# Patient Record
Sex: Female | Born: 1970 | Race: White | Hispanic: No | Marital: Married | Smoking: Current every day smoker
Health system: Southern US, Community
[De-identification: ages and names within clinical notes are randomized; demographics above are authoritative.]

## PROBLEM LIST (undated history)

## (undated) DIAGNOSIS — K5792 Diverticulitis of intestine, part unspecified, without perforation or abscess without bleeding: Secondary | ICD-10-CM

## (undated) HISTORY — PX: KNEE ARTHROSCOPY W/ ACL RECONSTRUCTION: SHX1858

---

## 2009-06-16 ENCOUNTER — Ambulatory Visit: Payer: Self-pay | Admitting: Internal Medicine

## 2010-08-21 ENCOUNTER — Ambulatory Visit: Payer: Self-pay | Admitting: Internal Medicine

## 2011-10-22 ENCOUNTER — Ambulatory Visit: Payer: Self-pay

## 2011-10-22 LAB — URINALYSIS, COMPLETE
Glucose,UR: NEGATIVE mg/dL (ref 0–75)
Leukocyte Esterase: NEGATIVE
Nitrite: NEGATIVE
Ph: 6 (ref 4.5–8.0)
Protein: NEGATIVE
Specific Gravity: 1.015 (ref 1.003–1.030)

## 2011-10-22 LAB — BASIC METABOLIC PANEL
Anion Gap: 6 — ABNORMAL LOW (ref 7–16)
Calcium, Total: 8.8 mg/dL (ref 8.5–10.1)
Co2: 30 mmol/L (ref 21–32)
EGFR (African American): >60
EGFR (Non-African Amer.): >60
Osmolality: 277 (ref 275–301)

## 2011-10-22 LAB — CBC WITH DIFFERENTIAL/PLATELET
Basophil #: 0 10*3/uL (ref 0.0–0.1)
Eosinophil %: 0.7 %
HCT: 42.5 % (ref 35.0–47.0)
HGB: 14.5 g/dL (ref 12.0–16.0)
Lymphocyte #: 1 10*3/uL (ref 1.0–3.6)
MCV: 95 fL (ref 80–100)
Monocyte %: 7.8 %
Neutrophil #: 8.7 10*3/uL — ABNORMAL HIGH (ref 1.4–6.5)
RBC: 4.48 10*6/uL (ref 3.80–5.20)
RDW: 12.5 % (ref 11.5–14.5)
WBC: 10.6 10*3/uL (ref 3.6–11.0)

## 2011-10-22 LAB — PREGNANCY, URINE: Pregnancy Test, Urine: NEGATIVE m[IU]/mL

## 2012-11-29 IMAGING — CT CT ABD-PELV W/ CM
1 of 3 series · 14 of 32 positions shown, 19 images · IV contrast (isovue)
Comparison: None

REASON FOR EXAM: lt lower abd pain  CR  [phone]
COMMENTS:

PROCEDURE:     JACKI - JACKI ABDOMEN / PELVIS W  - October 22, 2011  [DATE]
RESULT:     History: Lower bowel pain
TECHNIQUE: Multiple axial images of the abdomen and pelvis were performed
from the lung bases to the pubic symphysis, with p.o. contrast and with 100
ml of Isovue 300 intravenous contrast.

[Series 3: soft tissue · axial · 0.67mm/px · z∈[-464,-62]mm · 14 of 152 slices shown, 19 images]
[im 9/152  soft-tissue]
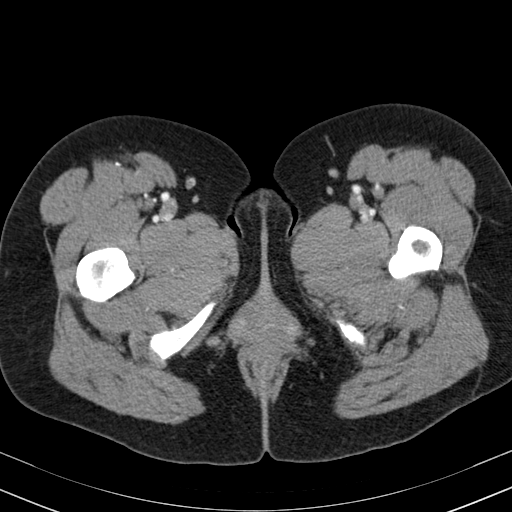
[im 9/152  bone]
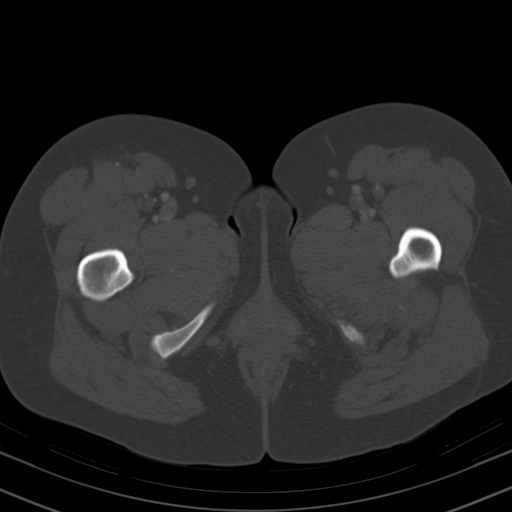
[im 17/152  soft-tissue]
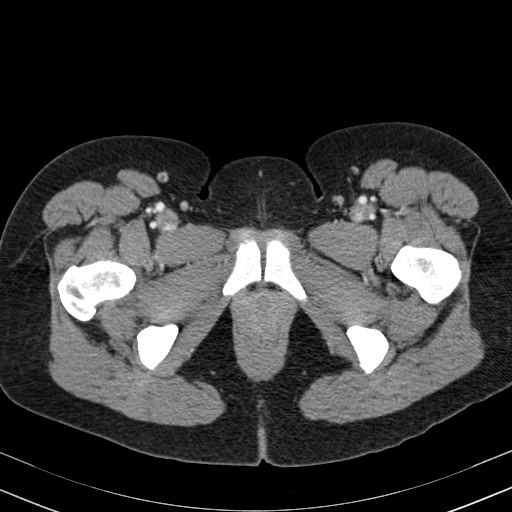
[im 34/152  soft-tissue]
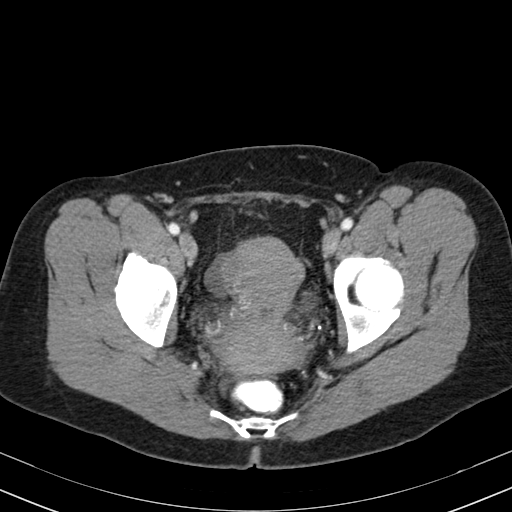
[im 42/152  soft-tissue]
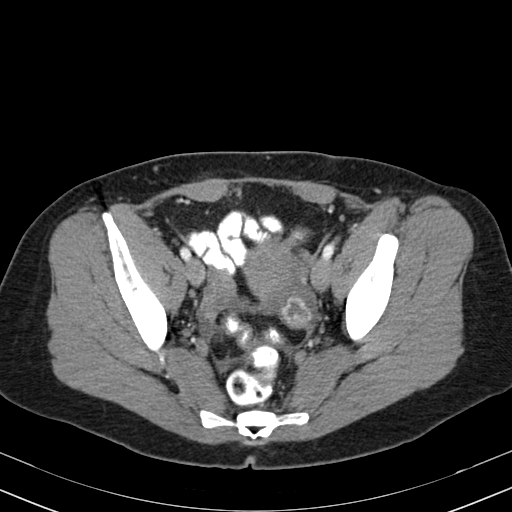
[im 51/152  soft-tissue]
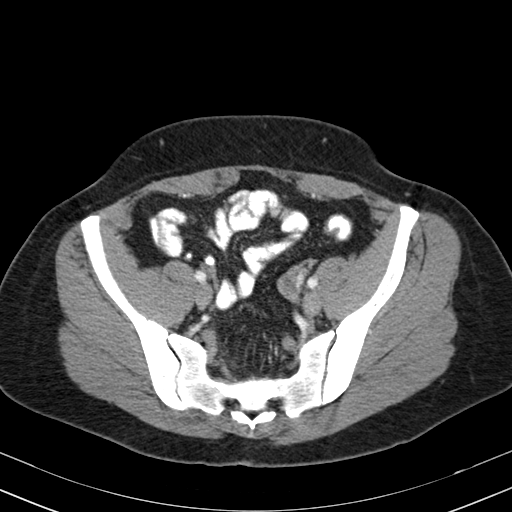
[im 68/152  soft-tissue]
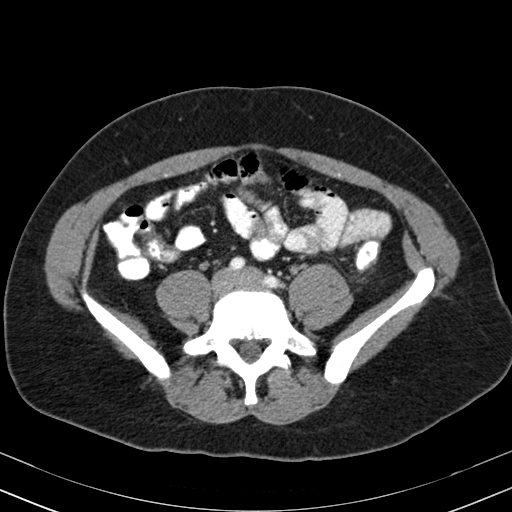
[im 76/152  soft-tissue]
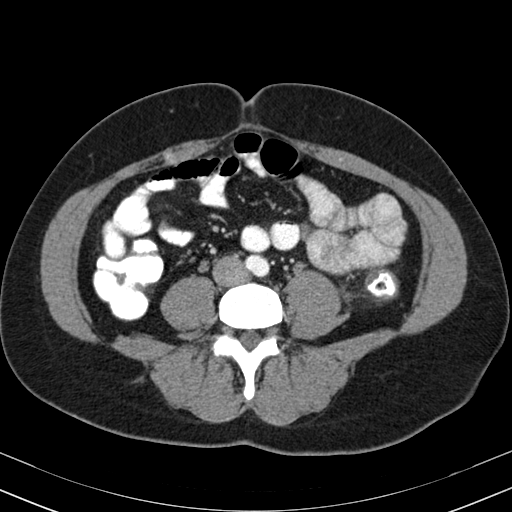
[im 84/152  soft-tissue]
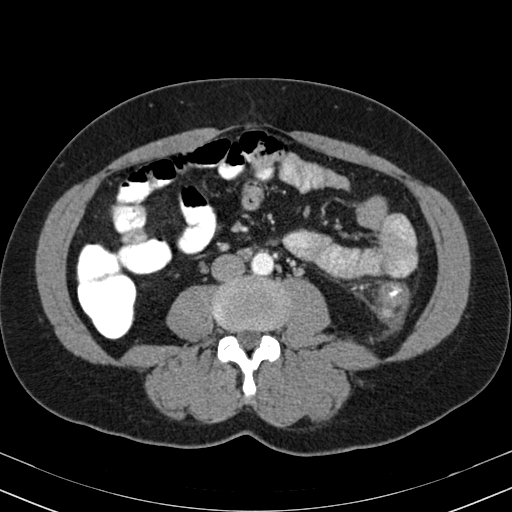
[im 101/152  soft-tissue]
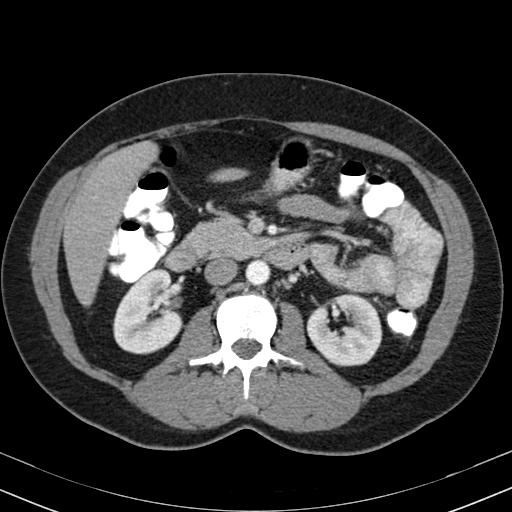
[im 101/152  bone]
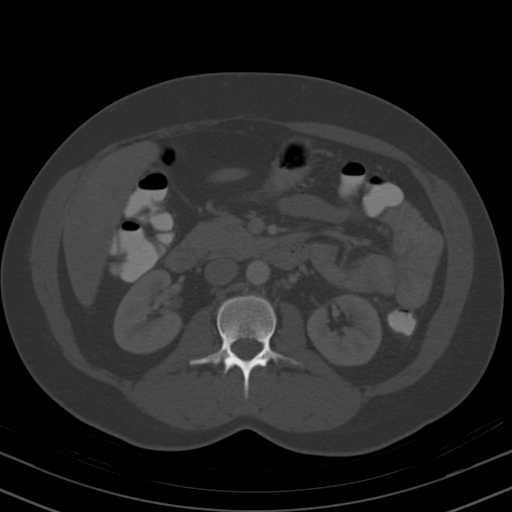
[im 110/152  soft-tissue]
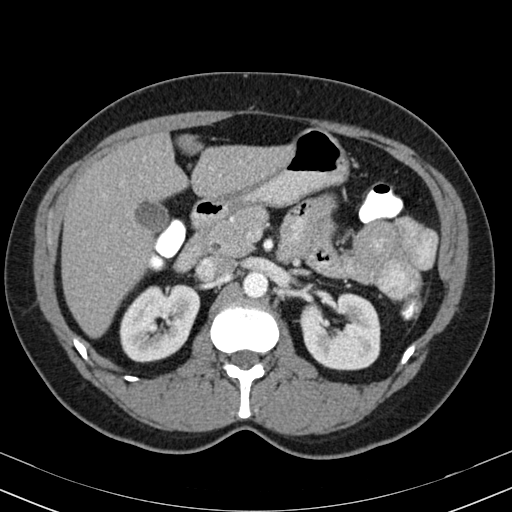
[im 118/152  soft-tissue]
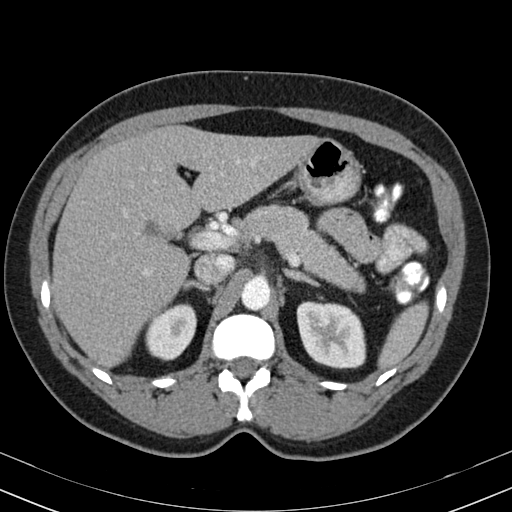
[im 118/152  lung]
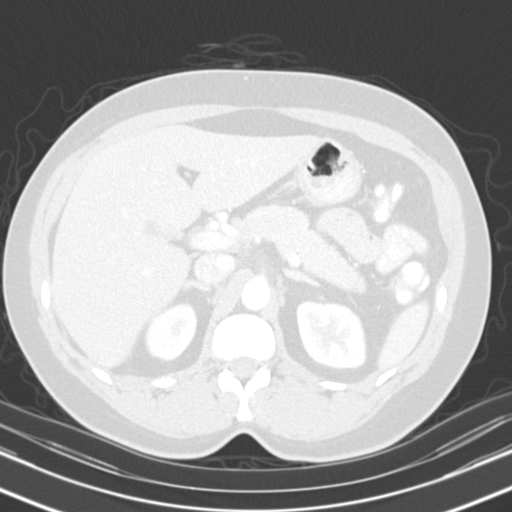
[im 126/152  lung]
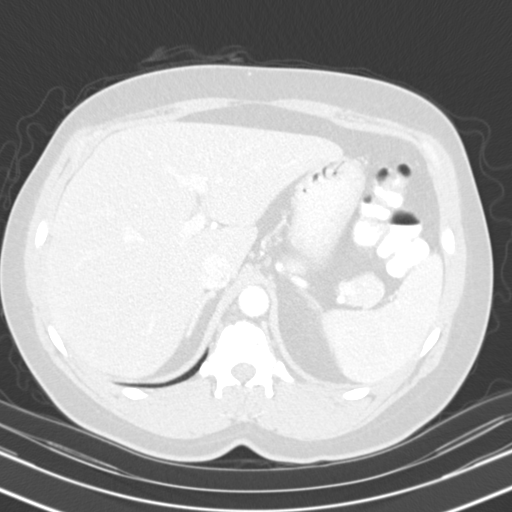
[im 135/152  soft-tissue]
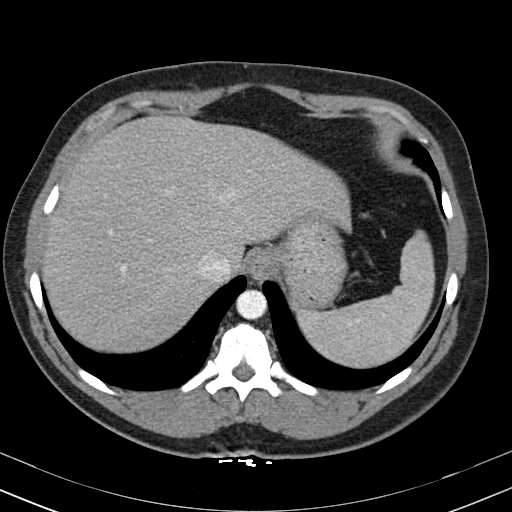
[im 135/152  lung]
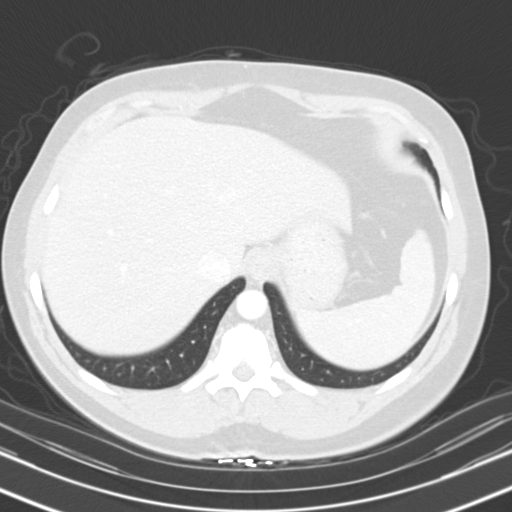
[im 143/152  soft-tissue]
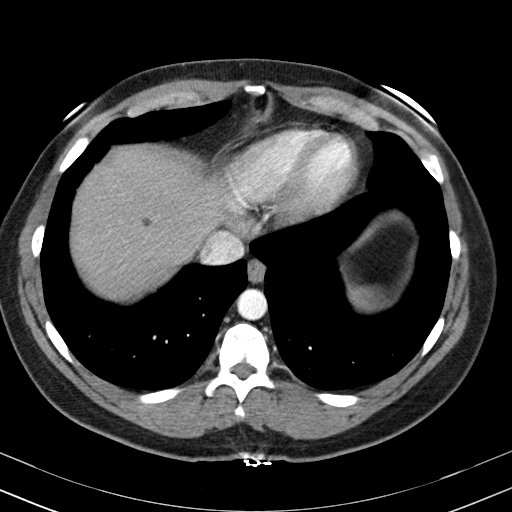
[im 143/152  lung]
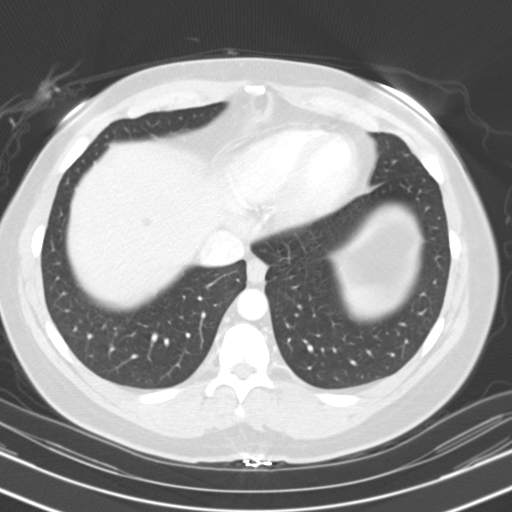

[14 of 32 positions shown; findings below may reference images not displayed]

FINDINGS: The lung bases are clear. There is no pneumothorax. The heart size is
normal.

The liver demonstrates no focal abnormality. There is no intrahepatic or
extrahepatic biliary ductal dilatation. The gallbladder is unremarkable. The
spleen demonstrates no focal abnormality. The kidneys, adrenal glands, and
pancreas are normal. The bladder is unremarkable.

The stomach, duodenum, small intestine, and large intestine demonstrate no
contrast extravasation or dilatation. There is bowel wall thickening and
pericolonic inflammatory changes involving the descending colon. There is a
solitary diverticulum in this region. The overall appearance is most
concerning for diverticulitis. There is no pneumoperitoneum, pneumatosis, or
portal venous gas. There is a small amount of pelvic free fluid. There is no
lymphadenopathy.

The abdominal aorta is normal in caliber with atherosclerosis.

The osseous structures are unremarkable.
IMPRESSION: 1. Bowel wall thickening and pericolonic inflammatory changes involving the
descending colon with a diverticulum in this region most concerning for
diverticulitis.

## 2016-02-03 ENCOUNTER — Emergency Department
Admission: EM | Admit: 2016-02-03 | Discharge: 2016-02-03 | Disposition: A | Discharge status: Home / Self Care | Payer: Managed Care, Other (non HMO) | Attending: Emergency Medicine | Admitting: Emergency Medicine

## 2016-02-03 ENCOUNTER — Telehealth: Payer: Self-pay | Admitting: *Deleted

## 2016-02-03 DIAGNOSIS — K5732 Diverticulitis of large intestine without perforation or abscess without bleeding: Secondary | ICD-10-CM | POA: Insufficient documentation

## 2016-02-03 DIAGNOSIS — R1032 Left lower quadrant pain: Secondary | ICD-10-CM | POA: Diagnosis present

## 2016-02-03 DIAGNOSIS — F17210 Nicotine dependence, cigarettes, uncomplicated: Secondary | ICD-10-CM | POA: Insufficient documentation

## 2016-02-03 HISTORY — DX: Diverticulitis of intestine, part unspecified, without perforation or abscess without bleeding: K57.92

## 2016-02-03 LAB — COMPREHENSIVE METABOLIC PANEL
ALT: 14 U/L (ref 14–54)
AST: 25 U/L (ref 15–41)
Albumin: 3.8 g/dL (ref 3.5–5.0)
Alkaline Phosphatase: 59 U/L (ref 38–126)
Anion gap: 6 (ref 5–15)
BUN: 12 mg/dL (ref 6–20)
CHLORIDE: 108 mmol/L (ref 101–111)
CO2: 23 mmol/L (ref 22–32)
CREATININE: 0.83 mg/dL (ref 0.44–1.00)
Calcium: 8.6 mg/dL — ABNORMAL LOW (ref 8.9–10.3)
Glucose, Bld: 102 mg/dL — ABNORMAL HIGH (ref 65–99)
Potassium: 4.1 mmol/L (ref 3.5–5.1)
Sodium: 137 mmol/L (ref 135–145)
Total Bilirubin: 0.5 mg/dL (ref 0.3–1.2)
Total Protein: 6.9 g/dL (ref 6.5–8.1)

## 2016-02-03 LAB — CBC
HCT: 34.9 % — ABNORMAL LOW (ref 35.0–47.0)
Hemoglobin: 11.6 g/dL — ABNORMAL LOW (ref 12.0–16.0)
MCH: 27.6 pg (ref 26.0–34.0)
MCHC: 33.1 g/dL (ref 32.0–36.0)
MCV: 83.3 fL (ref 80.0–100.0)
PLATELETS: 389 10*3/uL (ref 150–440)
RBC: 4.19 MIL/uL (ref 3.80–5.20)
RDW: 15.6 % — ABNORMAL HIGH (ref 11.5–14.5)
WBC: 9.6 10*3/uL (ref 3.6–11.0)

## 2016-02-03 LAB — POCT PREGNANCY, URINE: Preg Test, Ur: NEGATIVE

## 2016-02-03 LAB — URINALYSIS COMPLETE WITH MICROSCOPIC (ARMC ONLY)
BILIRUBIN URINE: NEGATIVE
Bacteria, UA: NONE SEEN
Glucose, UA: NEGATIVE mg/dL
KETONES UR: NEGATIVE mg/dL
Nitrite: NEGATIVE
PH: 5 (ref 5.0–8.0)
Protein, ur: NEGATIVE mg/dL
SPECIFIC GRAVITY, URINE: 1.019 (ref 1.005–1.030)

## 2016-02-03 LAB — LIPASE, BLOOD: LIPASE: 23 U/L (ref 11–51)

## 2016-02-03 MED ORDER — ONDANSETRON 4 MG PO TBDP: 4.0000 mg | ORAL_TABLET | Freq: Three times a day (TID) | ORAL | Status: AC | PRN

## 2016-02-03 MED ORDER — ONDANSETRON 4 MG PO TBDP
4.0000 mg | ORAL_TABLET | Freq: Once | ORAL | Status: AC
  Administered 2016-02-03: 4 mg via ORAL
  Filled 2016-02-03: qty 1

## 2016-02-03 MED ORDER — NAPROXEN 500 MG PO TABS: 500.0000 mg | ORAL_TABLET | Freq: Two times a day (BID) | ORAL | Status: AC

## 2016-02-03 MED ORDER — METRONIDAZOLE 500 MG PO TABS: 500.0000 mg | ORAL_TABLET | Freq: Three times a day (TID) | ORAL | Status: AC

## 2016-02-03 MED ORDER — CIPROFLOXACIN HCL 500 MG PO TABS: 500.0000 mg | ORAL_TABLET | Freq: Two times a day (BID) | ORAL | Status: AC

## 2016-02-03 NOTE — ED Provider Notes (Signed)
[facility] Emergency Department Provider Note  ____________________________________________  Time seen: 11:30 AM  I have reviewed the triage vital signs and the nursing notes.   HISTORY  Chief Complaint Abdominal Pain    HPI Megan Estrada is a 45 y.o. female complains of constant waxing and waning left lower quadrant abdominal pain over the past 2 days. Nonradiating. Associated with nausea but no vomiting. No diarrhea or bloody stools. No fevers or chills. Has a history of diverticulitis and this feels the same. No aggravating or alleviating factors     Past Medical History  Diagnosis Date  . Diverticulitis      There are no active problems to display for this patient.    Past Surgical History  Procedure Laterality Date  . Cesarean section    . Knee arthroscopy w/ acl reconstruction Bilateral      Current Outpatient Rx  Name  Route  Sig  Dispense  Refill  . ciprofloxacin (CIPRO) 500 MG tablet   Oral   Take 1 tablet (500 mg total) by mouth 2 (two) times daily.   14 tablet   0   . metroNIDAZOLE (FLAGYL) 500 MG tablet   Oral   Take 1 tablet (500 mg total) by mouth 3 (three) times daily.   30 tablet   0   . naproxen (NAPROSYN) 500 MG tablet   Oral   Take 1 tablet (500 mg total) by mouth 2 (two) times daily with a meal.   20 tablet   0   . ondansetron (ZOFRAN ODT) 4 MG disintegrating tablet   Oral   Take 1 tablet (4 mg total) by mouth every 8 (eight) hours as needed for nausea or vomiting.   20 tablet   0      Allergies Review of patient's allergies indicates no known allergies.   No family history on file.  Social History Social History  Substance Use Topics  . Smoking status: Current Every Day Smoker    Types: Cigarettes  . Smokeless tobacco: None  . Alcohol Use: No    Review of Systems  Constitutional:   No fever or chills. Eating and drinking normally Eyes:   No vision changes.  ENT:   No sore throat.  No rhinorrhea. Cardiovascular:   No chest pain. Respiratory:   No dyspnea or cough. Gastrointestinal:   Abdominal pain as above without vomiting or diarrhea.  Genitourinary:   Negative for dysuria or difficulty urinating. Musculoskeletal:   Negative for focal pain or swelling Neurological:   Negative for headaches 10-point ROS otherwise negative.  ____________________________________________   PHYSICAL EXAM:  VITAL SIGNS: ED Triage Vitals  Enc Vitals Group     BP 02/03/16 1051 130/87 mmHg     Pulse Rate 02/03/16 1051 97     Resp 02/03/16 1051 18     Temp 02/03/16 1051 98.4 F (36.9 C)     Temp Source 02/03/16 1051 Oral     SpO2 02/03/16 1051 100 %     Weight 02/03/16 1051 145 lb (65.772 kg)     Height 02/03/16 1051 5\' 2"  (1.575 m)     Head Cir --      Peak Flow --      Pain Score 02/03/16 1103 5     Pain Loc --      Pain Edu? --      Excl. in GC? --     Vital signs reviewed, nursing assessments reviewed.   Constitutional:   Alert  and oriented. Well appearing and in no distress. Eyes:   No scleral icterus. No conjunctival pallor. PERRL. EOMI.  No nystagmus. ENT   Head:   Normocephalic and atraumatic.   Nose:   No congestion/rhinnorhea. No septal hematoma   Mouth/Throat:   MMM, no pharyngeal erythema. No peritonsillar mass.    Neck:   No stridor. No SubQ emphysema. No meningismus. Hematological/Lymphatic/Immunilogical:   No cervical lymphadenopathy. Cardiovascular:   RRR. Symmetric bilateral radial and DP pulses.  No murmurs.  Respiratory:   Normal respiratory effort without tachypnea nor retractions. Breath sounds are clear and equal bilaterally. No wheezes/rales/rhonchi. Gastrointestinal:   Soft With left midabdomen tenderness. Non distended. There is no CVA tenderness.  No rebound, rigidity, or guarding. Genitourinary:   deferred Musculoskeletal:   Nontender with normal range of motion in all extremities. No joint effusions.  No lower extremity  tenderness.  No edema. Neurologic:   Normal speech and language.  CN 2-10 normal. Motor grossly intact. No gross focal neurologic deficits are appreciated.  Skin:    Skin is warm, dry and intact. No rash noted.  No petechiae, purpura, or bullae.  ____________________________________________    LABS (pertinent positives/negatives) (all labs ordered are listed, but only abnormal results are displayed) Labs Reviewed  COMPREHENSIVE METABOLIC PANEL - Abnormal; Notable for the following:    Glucose, Bld 102 (*)    Calcium 8.6 (*)    All other components within normal limits  CBC - Abnormal; Notable for the following:    Hemoglobin 11.6 (*)    HCT 34.9 (*)    RDW 15.6 (*)    All other components within normal limits  URINALYSIS COMPLETEWITH MICROSCOPIC ([facility] ONLY) - Abnormal; Notable for the following:    Color, Urine YELLOW (*)    APPearance CLEAR (*)    Hgb urine dipstick 1+ (*)    Leukocytes, UA TRACE (*)    Squamous Epithelial / LPF 0-5 (*)    All other components within normal limits  LIPASE, BLOOD  POC URINE PREG, ED  POCT PREGNANCY, URINE   ____________________________________________   EKG    ____________________________________________    RADIOLOGY    ____________________________________________   PROCEDURES   ____________________________________________   INITIAL IMPRESSION / ASSESSMENT AND PLAN / ED COURSE  Pertinent labs & imaging results that were available during my care of the patient were reviewed by me and considered in my medical decision making (see chart for details).  Patient presents with upper quadrant abdominal pain, very well-appearing with normal vital signs, normal white blood cell count, no distress. Low suspicion for perforation or abscess formation. No suspicion for torsion STI or PID or TOA. We'll discharge home with symptomatic management, Cipro Flagyl, follow-up with primary care. No imaging warranted  today.     ____________________________________________   FINAL CLINICAL IMPRESSION(S) / ED DIAGNOSES  Final diagnoses:  Diverticulitis of large intestine without perforation or abscess without bleeding       Portions of this note were generated with dragon dictation software. Dictation errors may occur despite best attempts at proofreading.   Sharman Cheek, MD 02/03/16 1210

## 2016-02-03 NOTE — Discharge Instructions (Signed)
Diverticulitis °Diverticulitis is inflammation or infection of small pouches in your colon that form when you have a condition called diverticulosis. The pouches in your colon are called diverticula. Your colon, or large intestine, is where water is absorbed and stool is formed. °Complications of diverticulitis can include: °· Bleeding. °· Severe infection. °· Severe pain. °· Perforation of your colon. °· Obstruction of your colon. °CAUSES  °Diverticulitis is caused by bacteria. °Diverticulitis happens when stool becomes trapped in diverticula. This allows bacteria to grow in the diverticula, which can lead to inflammation and infection. °RISK FACTORS °People with diverticulosis are at risk for diverticulitis. Eating a diet that does not include enough fiber from fruits and vegetables may make diverticulitis more likely to develop. °SYMPTOMS  °Symptoms of diverticulitis may include: °· Abdominal pain and tenderness. The pain is normally located on the left side of the abdomen, but may occur in other areas. °· Fever and chills. °· Bloating. °· Cramping. °· Nausea. °· Vomiting. °· Constipation. °· Diarrhea. °· Blood in your stool. °DIAGNOSIS  °Your health care provider will ask you about your medical history and do a physical exam. You may need to have tests done because many medical conditions can cause the same symptoms as diverticulitis. Tests may include: °· Blood tests. °· Urine tests. °· Imaging tests of the abdomen, including X-rays and CT scans. °When your condition is under control, your health care provider may recommend that you have a colonoscopy. A colonoscopy can show how severe your diverticula are and whether something else is causing your symptoms. °TREATMENT  °Most cases of diverticulitis are mild and can be treated at home. Treatment may include: °· Taking over-the-counter pain medicines. °· Following a clear liquid diet. °· Taking antibiotic medicines by mouth for 7-10 days. °More severe cases may  be treated at a hospital. Treatment may include: °· Not eating or drinking. °· Taking prescription pain medicine. °· Receiving antibiotic medicines through an IV tube. °· Receiving fluids and nutrition through an IV tube. °· Surgery. °HOME CARE INSTRUCTIONS  °· Follow your health care provider's instructions carefully. °· Follow a full liquid diet or other diet as directed by your health care provider. After your symptoms improve, your health care provider may tell you to change your diet. He or she may recommend you eat a high-fiber diet. Fruits and vegetables are good sources of fiber. Fiber makes it easier to pass stool. °· Take fiber supplements or probiotics as directed by your health care provider. °· Only take medicines as directed by your health care provider. °· Keep all your follow-up appointments. °SEEK MEDICAL CARE IF:  °· Your pain does not improve. °· You have a hard time eating food. °· Your bowel movements do not return to normal. °SEEK IMMEDIATE MEDICAL CARE IF:  °· Your pain becomes worse. °· Your symptoms do not get better. °· Your symptoms suddenly get worse. °· You have a fever. °· You have repeated vomiting. °· You have bloody or black, tarry stools. °MAKE SURE YOU:  °· Understand these instructions. °· Will watch your condition. °· Will get help right away if you are not doing well or get worse. °  °This information is not intended to replace advice given to you by your health care provider. Make sure you discuss any questions you have with your health care provider. °  °Document Released: 05/13/2005 Document Revised: 08/08/2013 Document Reviewed: 06/28/2013 °Elsevier Interactive Patient Education ©2016 [employer] ° °

## 2016-02-03 NOTE — ED Notes (Signed)
Pt c/o LLQ pain for the past 2 days with nausea.. States she has a hx of diverticulitis and feels the same.. Denies vomiting or diarrhea..Marland Kitchen

## 2016-02-03 NOTE — Telephone Encounter (Signed)
Patient called for appt this morning. Patient has not been seen since 12/23/2012. She has a hx of diverticulitis She was referred in 2014 to see GI-Dr. Mechele CollinElliott. Patient never had colonoscopy which was recommend due to cost. Explained to patient after speaking with Dr. Juanetta GoslingHawkins, she would need to go to ER if this is another flare and she is in pain. Explained that she would need a CT and we could perform her therefore would have to send to hospital for that. Patient verbalized under standing.[place]

## 2022-12-16 ENCOUNTER — Other Ambulatory Visit: Payer: Self-pay | Admitting: Nurse Practitioner

## 2022-12-16 DIAGNOSIS — Z1231 Encounter for screening mammogram for malignant neoplasm of breast: Secondary | ICD-10-CM
